# Patient Record
Sex: Female | Born: 1990 | Race: Black or African American | Hispanic: No | Marital: Single | Smoking: Current every day smoker
Health system: Southern US, Community
[De-identification: ages and names within clinical notes are randomized; demographics above are authoritative.]

---
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
## 2010-06-07 ENCOUNTER — Inpatient Hospital Stay (HOSPITAL_COMMUNITY)
Admission: AD | Admit: 2010-06-07 | Discharge: 2010-06-07 | Disposition: A | Discharge status: Home / Self Care | Payer: Medicaid Other | Source: Ambulatory Visit | Attending: Obstetrics and Gynecology | Admitting: Obstetrics and Gynecology

## 2010-06-07 DIAGNOSIS — O4700 False labor before 37 completed weeks of gestation, unspecified trimester: Secondary | ICD-10-CM | POA: Insufficient documentation

## 2010-06-07 LAB — URINALYSIS, ROUTINE W REFLEX MICROSCOPIC
Bilirubin Urine: NEGATIVE
Hgb urine dipstick: NEGATIVE
Ketones, ur: NEGATIVE mg/dL
Protein, ur: NEGATIVE mg/dL
Urine Glucose, Fasting: NEGATIVE mg/dL
Urobilinogen, UA: 1 mg/dL (ref 0.0–1.0)
pH: 6.5 (ref 5.0–8.0)

## 2015-08-17 ENCOUNTER — Encounter (HOSPITAL_BASED_OUTPATIENT_CLINIC_OR_DEPARTMENT_OTHER): Payer: Self-pay | Admitting: *Deleted

## 2015-08-17 ENCOUNTER — Emergency Department (HOSPITAL_BASED_OUTPATIENT_CLINIC_OR_DEPARTMENT_OTHER)
Admission: EM | Admit: 2015-08-17 | Discharge: 2015-08-17 | Disposition: A | Discharge status: Home / Self Care | Payer: Medicaid Other | Attending: Emergency Medicine | Admitting: Emergency Medicine

## 2015-08-17 DIAGNOSIS — N760 Acute vaginitis: Secondary | ICD-10-CM | POA: Insufficient documentation

## 2015-08-17 DIAGNOSIS — B9689 Other specified bacterial agents as the cause of diseases classified elsewhere: Secondary | ICD-10-CM

## 2015-08-17 DIAGNOSIS — N72 Inflammatory disease of cervix uteri: Secondary | ICD-10-CM

## 2015-08-17 DIAGNOSIS — F17210 Nicotine dependence, cigarettes, uncomplicated: Secondary | ICD-10-CM | POA: Insufficient documentation

## 2015-08-17 LAB — URINALYSIS, ROUTINE W REFLEX MICROSCOPIC
BILIRUBIN URINE: NEGATIVE
GLUCOSE, UA: NEGATIVE mg/dL
HGB URINE DIPSTICK: NEGATIVE
KETONES UR: NEGATIVE mg/dL
Nitrite: NEGATIVE
PROTEIN: NEGATIVE mg/dL
Specific Gravity, Urine: 1.026 (ref 1.005–1.030)
pH: 6.5 (ref 5.0–8.0)

## 2015-08-17 LAB — WET PREP, GENITAL
Sperm: NONE SEEN
TRICH WET PREP: NONE SEEN
Yeast Wet Prep HPF POC: NONE SEEN

## 2015-08-17 LAB — URINE MICROSCOPIC-ADD ON: RBC / HPF: NONE SEEN RBC/hpf (ref 0–5)

## 2015-08-17 LAB — PREGNANCY, URINE: PREG TEST UR: NEGATIVE

## 2015-08-17 MED ORDER — METRONIDAZOLE 500 MG PO TABS: 500.0000 mg | ORAL_TABLET | Freq: Two times a day (BID) | ORAL | Status: DC

## 2015-08-17 MED ORDER — CEFTRIAXONE SODIUM 250 MG IJ SOLR
250.0000 mg | Freq: Once | INTRAMUSCULAR | Status: AC
  Administered 2015-08-17: 250 mg via INTRAMUSCULAR
  Filled 2015-08-17: qty 250

## 2015-08-17 MED ORDER — FLUCONAZOLE 150 MG PO TABS: 150.0000 mg | ORAL_TABLET | Freq: Once | ORAL | Status: DC

## 2015-08-17 MED ORDER — DOXYCYCLINE HYCLATE 100 MG PO CAPS: 100.0000 mg | ORAL_CAPSULE | Freq: Two times a day (BID) | ORAL | Status: DC

## 2015-08-17 NOTE — ED Provider Notes (Signed)
CSN: [id number]     Arrival date & time 08/17/15  1754 History   By signing my name below, I, [name], attest that this documentation has been prepared under the direction and in the presence of Loren Racer, MD.  Electronically Signed: [name], ED Scribe. 08/17/2015. 8:21 PM.   Chief Complaint  Patient presents with  . Vaginal Discharge   The history is provided by the patient. No language interpreter was used.    HPI Comments: Glenda Aguirre is a 25 y.o. female without any pertinent past medical history who presents to the Emergency Department complaining of constant, ongoing vaginal Discharge x 2 days. Discharge is described as white in color. She also reports intermittent vaginal pain. No aggravating or alleviating factors at this time. No OTC medications or home remedies attempted prior to arrival. No recent fever, chills, dysuria, sore throat, rashes, or hematuria. Pt states she is currently sexually active and admits to having recent unprotected sex. She admits to previously being treated for STDs. However, pt believes this may be related to a "bad yeast infection". No known allergies to medications.   PCP: Pcp Not In System    History reviewed. No pertinent past medical history. History reviewed. No pertinent past surgical history. History reviewed. No pertinent family history. Social History  Substance Use Topics  . Smoking status: Current Every Day Smoker -- 1.00 packs/day    Types: Cigarettes  . Smokeless tobacco: None  . Alcohol Use: No   OB History    No data available     Review of Systems  Constitutional: Negative for fever and chills.  HENT: Negative for sore throat.   Respiratory: Negative for cough.   Cardiovascular: Negative for chest pain.  Gastrointestinal: Negative for nausea, vomiting, abdominal pain, diarrhea and constipation.  Genitourinary: Positive for vaginal discharge and vaginal pain. Negative for dysuria, frequency, hematuria, flank pain,  vaginal bleeding and pelvic pain.  Musculoskeletal: Negative for myalgias, back pain, neck pain and neck stiffness.  Skin: Negative for rash.  Neurological: Negative for dizziness, weakness, numbness and headaches.  Psychiatric/Behavioral: Negative for confusion.  All other systems reviewed and are negative.     Allergies  Review of patient's allergies indicates no known allergies.  Home Medications   Prior to Admission medications   Medication Sig Start Date End Date Taking? Authorizing Provider  doxycycline (VIBRAMYCIN) 100 MG capsule Take 1 capsule (100 mg total) by mouth 2 (two) times daily. One po bid x 7 days 08/17/15   Loren Racer, MD  metroNIDAZOLE (FLAGYL) 500 MG tablet Take 1 tablet (500 mg total) by mouth 2 (two) times daily. 08/17/15   Loren Racer, MD   Triage Vitals: BP 132/71 mmHg  Pulse 76  Temp(Src) 98.8 F (37.1 C) (Oral)  Resp 18  Ht  (1.676 m)  Wt 180 lb (81.647 kg)  BMI 29.07 kg/m2  SpO2 100%   Physical Exam  Constitutional: She is oriented to person, place, and time. She appears well-developed and well-nourished. No distress.  HENT:  Head: Normocephalic and atraumatic.  Mouth/Throat: Oropharynx is clear and moist.  Eyes: EOM are normal. Pupils are equal, round, and reactive to light.  Neck: Normal range of motion. Neck supple.  Cardiovascular: Normal rate and regular rhythm.   Pulmonary/Chest: Effort normal and breath sounds normal. No respiratory distress. She has no wheezes. She has no rales. She exhibits no tenderness.  Abdominal: Soft. Bowel sounds are normal. She exhibits no distension and no mass. There is no tenderness.  There is no rebound and no guarding.  Genitourinary:  Thick white vaginal discharge. No cervical motion tenderness. No fundal or adnexal masses or tenderness.  Musculoskeletal: Normal range of motion. She exhibits no edema or tenderness.  No CVA tenderness bilaterally.  Neurological: She is alert and oriented to  person, place, and time.  Moves all extremities without deficit. Sensation is fully intact.  Skin: Skin is warm and dry. No rash noted. No erythema.  Psychiatric: She has a normal mood and affect. Her behavior is normal.  Nursing note and vitals reviewed.   ED Course  Procedures (including critical care time)  DIAGNOSTIC STUDIES: Oxygen Saturation is 98% on RA, Normal by my interpretation.    COORDINATION OF CARE: 8:16 PM- Will order urinalysis and pregnancy urine. Discussed treatment plan with pt at bedside and pt agreed to plan.     Labs Review Labs Reviewed  WET PREP, GENITAL - Abnormal; Notable for the following:    Clue Cells Wet Prep HPF POC PRESENT (*)    WBC, Wet Prep HPF POC MANY (*)    All other components within normal limits  URINALYSIS, ROUTINE W REFLEX MICROSCOPIC (NOT AT [facility]) - Abnormal; Notable for the following:    Leukocytes, UA SMALL (*)    All other components within normal limits  URINE MICROSCOPIC-ADD ON - Abnormal; Notable for the following:    Squamous Epithelial / LPF 0-5 (*)    Bacteria, UA FEW (*)    All other components within normal limits  PREGNANCY, URINE  GC/CHLAMYDIA PROBE AMP ([place]) NOT AT [facility]    Imaging Review No results found. I have personally reviewed and evaluated these images and lab results as part of my medical decision-making.   EKG Interpretation None      MDM   Final diagnoses:  Bacterial vaginosis  Cervicitis    I personally performed the services described in this documentation, which was scribed in my presence. The recorded information has been reviewed and is accurate.   White blood cells and clue cells present on wet prep. We'll treat for BV and cervicitis. Advised of same from sex for one week and have all partners evaluated  Loren Raceravid Khole Branch, MD 08/17/15 2151

## 2015-08-17 NOTE — ED Notes (Signed)
Vaginal discharge and itching x 2 days.

## 2015-08-17 NOTE — ED Notes (Signed)
Pt verbalizes understanding of d/c instructions and denies any further needs at this time. 

## 2015-08-17 NOTE — Discharge Instructions (Signed)
Cervicitis Cervicitis is a soreness and swelling (inflammation) of the cervix. Your cervix is located at the bottom of your uterus. It opens up to the vagina. CAUSES   Sexually transmitted infections (STIs).   Allergic reaction.   Medicines or birth control devices that are put in the vagina.   Injury to the cervix.   Bacterial infections.  RISK FACTORS You are at greater risk if you:  Have unprotected sexual intercourse.  Have sexual intercourse with many partners.  Began sexual intercourse at an early age.  Have a history of STIs. SYMPTOMS  There may be no symptoms. If symptoms occur, they may include:   Gray, white, yellow, or bad-smelling vaginal discharge.   Pain or itching of the area outside the vagina.   Painful sexual intercourse.   Lower abdominal or lower back pain, especially during intercourse.   Frequent urination.   Abnormal vaginal bleeding between periods, after sexual intercourse, or after menopause.   Pressure or a heavy feeling in the pelvis.  DIAGNOSIS  Diagnosis is made after a pelvic exam. Other tests may include:   Examination of any discharge under a microscope (wet prep).   A Pap test.  TREATMENT  Treatment will depend on the cause of cervicitis. If it is caused by an STI, both you and your partner will need to be treated. Antibiotic medicines will be given.  HOME CARE INSTRUCTIONS   Do not have sexual intercourse until your health care provider says it is okay.   Do not have sexual intercourse until your partner has been treated, if your cervicitis is caused by an STI.   Take your antibiotics as directed. Finish them even if you start to feel better.  SEEK MEDICAL CARE IF:  Your symptoms come back.   You have a fever.  MAKE SURE YOU:   Understand these instructions.  Will watch your condition.  Will get help right away if you are not doing well or get worse.   This information is not intended to replace  advice given to you by your health care provider. Make sure you discuss any questions you have with your health care provider.   Document Released: 04/07/2005 Document Revised: 04/12/2013 Document Reviewed: 09/29/2012 Elsevier Interactive Patient Education 2016 [employer]  Bacterial Vaginosis Bacterial vaginosis is an infection of the vagina. It happens when too many germs (bacteria) grow in the vagina. Having this infection puts you at risk for getting other infections from sex. Treating this infection can help lower your risk for other infections, such as:   Chlamydia.  Gonorrhea.  HIV.  Herpes. HOME CARE  Take your medicine as told by your doctor.  Finish your medicine even if you start to feel better.  Tell your sex partner that you have an infection. They should see their doctor for treatment.  During treatment:  Avoid sex or use condoms correctly.  Do not douche.  Do not drink alcohol unless your doctor tells you it is ok.  Do not breastfeed unless your doctor tells you it is ok. GET HELP IF:  You are not getting better after 3 days of treatment.  You have more grey fluid (discharge) coming from your vagina than before.  You have more pain than before.  You have a fever. MAKE SURE YOU:   Understand these instructions.  Will watch your condition.  Will get help right away if you are not doing well or get worse.   This information is not intended to replace advice  given to you by your health care provider. Make sure you discuss any questions you have with your health care provider.   Document Released: 01/15/2008 Document Revised: 04/28/2014 Document Reviewed: 11/17/2012 Elsevier Interactive Patient Education [employer].

## 2015-08-20 LAB — GC/CHLAMYDIA PROBE AMP (~~LOC~~) NOT AT ARMC
Chlamydia: NEGATIVE
NEISSERIA GONORRHEA: NEGATIVE

## 2015-09-03 ENCOUNTER — Emergency Department (HOSPITAL_BASED_OUTPATIENT_CLINIC_OR_DEPARTMENT_OTHER)
Admission: EM | Admit: 2015-09-03 | Discharge: 2015-09-03 | Disposition: A | Discharge status: Home / Self Care | Payer: Medicaid Other | Attending: Emergency Medicine | Admitting: Emergency Medicine

## 2015-09-03 ENCOUNTER — Emergency Department (HOSPITAL_BASED_OUTPATIENT_CLINIC_OR_DEPARTMENT_OTHER): Payer: Medicaid Other

## 2015-09-03 ENCOUNTER — Encounter (HOSPITAL_BASED_OUTPATIENT_CLINIC_OR_DEPARTMENT_OTHER): Payer: Self-pay

## 2015-09-03 DIAGNOSIS — R519 Headache, unspecified: Secondary | ICD-10-CM

## 2015-09-03 DIAGNOSIS — F17210 Nicotine dependence, cigarettes, uncomplicated: Secondary | ICD-10-CM | POA: Insufficient documentation

## 2015-09-03 DIAGNOSIS — R51 Headache: Secondary | ICD-10-CM | POA: Insufficient documentation

## 2015-09-03 MED ORDER — METOCLOPRAMIDE HCL 10 MG PO TABS
10.0000 mg | ORAL_TABLET | Freq: Once | ORAL | Status: AC
  Administered 2015-09-03: 10 mg via ORAL
  Filled 2015-09-03: qty 1

## 2015-09-03 MED ORDER — KETOROLAC TROMETHAMINE 15 MG/ML IJ SOLN
15.0000 mg | Freq: Once | INTRAMUSCULAR | Status: AC
  Administered 2015-09-03: 15 mg via INTRAVENOUS
  Filled 2015-09-03: qty 1

## 2015-09-03 NOTE — ED Provider Notes (Signed)
CSN: [id number]     Arrival date & time 09/03/15  1116 History   First MD Initiated Contact with Patient 09/03/15 1252     Chief Complaint  Patient presents with  . Headache    HPI   25 year old female presents today with complaints of headache. Patient reports significant past medical history of headaches, notes that this is similar to typical presentation. She describes it as pain behind her eyes, with throbbing in her temples. She reports light sensitivity, notes some nausea, denies any vomiting. Patient denies any fever or chills, neck stiffness, trauma to the head, or any focal neurological deficits. She denies any excessive caffeine, drug use, or any known exacerbating factors. Patient reports symptoms are typical of previous although she's never had a headache last this long. She has never been formally evaluated for headaches.  History reviewed. No pertinent past medical history. History reviewed. No pertinent past surgical history. No family history on file. Social History  Substance Use Topics  . Smoking status: Current Some Day Smoker -- 1.00 packs/day    Types: Cigarettes  . Smokeless tobacco: None  . Alcohol Use: No   OB History    No data available     Review of Systems  All other systems reviewed and are negative.  Allergies  Review of patient's allergies indicates no known allergies.  Home Medications   Prior to Admission medications   Not on File   BP 99/64 mmHg  Pulse 73  Temp(Src) 99 F (37.2 C) (Oral)  Resp 18  Ht 5\' 6"  (1.676 m)  Wt 87.998 kg  BMI 31.33 kg/m2  SpO2 100%   Physical Exam  Constitutional: She is oriented to person, place, and time. She appears well-developed and well-nourished.  HENT:  Head: Normocephalic and atraumatic.  Eyes: Conjunctivae are normal. Pupils are equal, round, and reactive to light. Right eye exhibits no discharge. Left eye exhibits no discharge. No scleral icterus.  Neck: Normal range of motion. Neck supple. No  JVD present. No tracheal deviation present.  No meningeal signs  Pulmonary/Chest: Effort normal. No stridor.  Neurological: She is alert and oriented to person, place, and time. She has normal strength. No cranial nerve deficit or sensory deficit. She displays a negative Romberg sign. Coordination and gait normal. GCS eye subscore is 4. GCS verbal subscore is 5. GCS motor subscore is 6.  Reflex Scores:      Patellar reflexes are 2+ on the right side and 2+ on the left side. Skin: Skin is warm and dry. No rash noted. No erythema.  Psychiatric: She has a normal mood and affect. Her behavior is normal. Judgment and thought content normal.  Nursing note and vitals reviewed.   ED Course  Procedures (including critical care time) Labs Review Labs Reviewed - No data to display  Imaging Review Ct Head Wo Contrast  09/03/2015  CLINICAL DATA:  Four day history of headache.  Nausea for 1 day EXAM: CT HEAD WITHOUT CONTRAST TECHNIQUE: Contiguous axial images were obtained from the base of the skull through the vertex without intravenous contrast. COMPARISON:  None. FINDINGS: The ventricles are normal in size and configuration. There is no intracranial mass, hemorrhage, extra-axial fluid collection, or midline shift. Gray-white compartments appear normal. No acute infarct evident. Bony calvarium appears intact. Visualized mastoid air cells are clear. Visualized orbits appear symmetric bilaterally. IMPRESSION: Study within normal limits. Electronically Signed   By: Bretta BangWilliam  Woodruff III M.D.   On: 09/03/2015 14:04   I have personally  reviewed and evaluated these images and lab results as part of my medical decision-making.   EKG Interpretation None      MDM   Final diagnoses:  Acute nonintractable headache, unspecified headache type    Labs:   Imaging: cT head  Consults:  Therapeutics:Toradol, Reglan  Discharge Meds:   Assessment/Plan: 25 year old female presents today with headache.  Patient has significant past history of the same. Today's presentation is typical of the same although patient has never had headaches lasting this long. Patient has never been formally evaluated for headaches. Patient had normal CT head here, good symptomatic improvement with above medications. Patient is afebrile, nontoxic in no acute distress., No red flags for headache. She was discharged home with symptomatic urine structures and strict return precautions. She verbalized her understanding and agreement today's plan had no further questions or concerns at the time of discharge.        Eyvonne Mechanic, PA-C 09/03/15 1533  Laurence Spates, MD 09/05/15 [phone]

## 2015-09-03 NOTE — ED Notes (Signed)
C/o HA x 4 days-pain to bilat temples-NAD-steady gait

## 2015-09-03 NOTE — ED Notes (Signed)
Patient transported to CT 

## 2015-09-27 ENCOUNTER — Emergency Department (HOSPITAL_BASED_OUTPATIENT_CLINIC_OR_DEPARTMENT_OTHER)
Admission: EM | Admit: 2015-09-27 | Discharge: 2015-09-28 | Disposition: A | Discharge status: Home / Self Care | Payer: BLUE CROSS/BLUE SHIELD | Attending: Emergency Medicine | Admitting: Emergency Medicine

## 2015-09-27 ENCOUNTER — Encounter (HOSPITAL_BASED_OUTPATIENT_CLINIC_OR_DEPARTMENT_OTHER): Payer: Self-pay

## 2015-09-27 DIAGNOSIS — R51 Headache: Secondary | ICD-10-CM | POA: Diagnosis present

## 2015-09-27 DIAGNOSIS — F17210 Nicotine dependence, cigarettes, uncomplicated: Secondary | ICD-10-CM | POA: Diagnosis not present

## 2015-09-27 DIAGNOSIS — R519 Headache, unspecified: Secondary | ICD-10-CM

## 2015-09-27 NOTE — ED Notes (Signed)
Pt c/o headache for a week with mild nausea and mild photosensitivity, not relieved by OTC meds

## 2015-09-28 MED ORDER — DIPHENHYDRAMINE HCL 50 MG/ML IJ SOLN
25.0000 mg | Freq: Once | INTRAMUSCULAR | Status: AC
  Administered 2015-09-28: 25 mg via INTRAVENOUS
  Filled 2015-09-28: qty 1

## 2015-09-28 MED ORDER — SODIUM CHLORIDE 0.9 % IV BOLUS (SEPSIS)
1000.0000 mL | Freq: Once | INTRAVENOUS | Status: AC
  Administered 2015-09-28: 1000 mL via INTRAVENOUS

## 2015-09-28 MED ORDER — PROCHLORPERAZINE EDISYLATE 5 MG/ML IJ SOLN
5.0000 mg | Freq: Once | INTRAMUSCULAR | Status: AC
  Administered 2015-09-28: 5 mg via INTRAMUSCULAR
  Filled 2015-09-28: qty 2

## 2015-09-28 MED ORDER — KETOROLAC TROMETHAMINE 30 MG/ML IJ SOLN
30.0000 mg | Freq: Once | INTRAMUSCULAR | Status: AC
  Administered 2015-09-28: 30 mg via INTRAVENOUS
  Filled 2015-09-28: qty 1

## 2015-09-28 NOTE — ED Provider Notes (Signed)
CSN: [id number]     Arrival date & time 09/27/15  2342 History   First MD Initiated Contact with Patient 09/28/15 0001     Chief Complaint  Patient presents with  . Headache     (Consider location/radiation/quality/duration/timing/severity/associated sxs/prior Treatment) Patient is a 25 y.o. female presenting with headaches.  Headache Associated symptoms: no fever, no nausea, no vomiting and no weakness    Glenda Aguirre is a 25 y.o. female who complains of headaches for 1 week(s). Description of pain: dull pain, global. Associated symptoms: mild photophobia. Pain relief: intermittent relief with  otc meds, however HA rebounds.. Precipitating factors: none. She denies a history of recent head injury.  Prior neurological history: negative for, phonophobia, UL throbbing, N/V, visual changes, stiff neck, neck pain, rash, or "thunderclap" onset. Denies Fever.   History reviewed. No pertinent past medical history. History reviewed. No pertinent past surgical history. No family history on file. Social History  Substance Use Topics  . Smoking status: Current Some Day Smoker -- 1.00 packs/day    Types: Cigarettes  . Smokeless tobacco: None  . Alcohol Use: No   OB History    No data available     Review of Systems  Constitutional: Negative for fever.  Eyes: Negative for visual disturbance.  Gastrointestinal: Negative for nausea and vomiting.  Neurological: Positive for headaches. Negative for weakness.  All other systems reviewed and are negative.     Allergies  Review of patient's allergies indicates no known allergies.  Home Medications   Prior to Admission medications   Not on File   BP 120/64 mmHg  Pulse 94  Temp(Src) 98.8 F (37.1 C) (Oral)  Resp 18  Ht 5\' 6"  (1.676 m)  Wt 87.998 kg  BMI 31.33 kg/m2  SpO2 100% Physical Exam  Constitutional: She is oriented to person, place, and time. She appears well-developed and well-nourished. No distress.  HENT:  Head:  Normocephalic and atraumatic.  Mouth/Throat: Oropharynx is clear and moist.  Eyes: Conjunctivae and EOM are normal. Pupils are equal, round, and reactive to light. No scleral icterus.  No horizontal, vertical or rotational nystagmus  Neck: Normal range of motion. Neck supple.  Full active and passive ROM without pain No midline or paraspinal tenderness No nuchal rigidity or meningeal signs  Cardiovascular: Normal rate, regular rhythm and intact distal pulses.   Pulmonary/Chest: Effort normal and breath sounds normal. No respiratory distress. She has no wheezes. She has no rales.  Abdominal: Soft. Bowel sounds are normal. There is no tenderness. There is no rebound and no guarding.  Musculoskeletal: Normal range of motion.  Lymphadenopathy:    She has no cervical adenopathy.  Neurological: She is alert and oriented to person, place, and time. She has normal reflexes. No cranial nerve deficit. She exhibits normal muscle tone. Coordination normal.  Mental Status:  Alert, oriented, thought content appropriate. Speech fluent without evidence of aphasia. Able to follow 2 step commands without difficulty.  Cranial Nerves:  II:  Peripheral visual fields grossly normal, pupils equal, round, reactive to light III,IV, VI: ptosis not present, extra-ocular motions intact bilaterally  V,VII: smile symmetric, facial light touch sensation equal VIII: hearing grossly normal bilaterally  IX,X: midline uvula rise  XI: bilateral shoulder shrug equal and strong XII: midline tongue extension  Motor:  5/5 in upper and lower extremities bilaterally including strong and equal grip strength and dorsiflexion/plantar flexion Sensory: Pinprick and light touch normal in all extremities.  Deep Tendon Reflexes: 2+ and symmetric  Cerebellar: normal  finger-to-nose with bilateral upper extremities Gait: normal gait and balance CV: distal pulses palpable throughout   Skin: Skin is warm and dry. No rash noted. She is not  diaphoretic.  Psychiatric: She has a normal mood and affect. Her behavior is normal. Judgment and thought content normal.  Nursing note and vitals reviewed.   ED Course  Procedures (including critical care time) Labs Review Labs Reviewed - No data to display  Imaging Review No results found. I have personally reviewed and evaluated these images and lab results as part of my medical decision-making.   EKG Interpretation None      MDM   Final diagnoses:  None  Pt HA treated and improved while in ED.  Presentation is like pts typical HA and non concerning for [facility], [facility], Meningitis, or temporal arteritis. Pt is afebrile with no focal neuro deficits, nuchal rigidity, or change in vision. Pt is to follow up with PCP to discuss prophylactic medication. Pt verbalizes understanding and is agreeable with plan to dc.     Arthor Captain, PA-C 10/01/15 [id number]  Rolland Porter, MD 10/13/15 1330

## 2015-09-28 NOTE — Discharge Instructions (Signed)
You are having a headache. No specific cause was found today for your headache. It may have been a migraine or other cause of headache. Stress, anxiety, fatigue, and depression are common triggers for headaches. Your headache today does not appear to be life-threatening or require hospitalization, but often the exact cause of headaches is not determined in the emergency department. Therefore, follow-up with your doctor is very important to find out what may have caused your headache, and whether or not you need any further diagnostic testing or treatment. Sometimes headaches can appear benign (not harmful), but then more serious symptoms can develop which should prompt an immediate re-evaluation by your doctor or the emergency department. °SEEK MEDICAL ATTENTION IF: °You develop possible problems with medications prescribed.  °The medications don't resolve your headache, if it recurs , or if you have multiple episodes of vomiting or can't take fluids. °You have a change from the usual headache. °RETURN IMMEDIATELY IF you develop a sudden, severe headache or confusion, become poorly responsive or faint, develop a fever above 100.4F or problem breathing, have a change in speech, vision, swallowing, or understanding, or develop new weakness, numbness, tingling, incoordination, or have a seizure. ° °General Headache Without Cause °A headache is pain or discomfort felt around the head or neck area. The specific cause of a headache may not be found. There are many causes and types of headaches. A few common ones are: °· Tension headaches. °· Migraine headaches. °· Cluster headaches. °· Chronic daily headaches. °HOME CARE INSTRUCTIONS  °Watch your condition for any changes. Take these steps to help with your condition: °Managing Pain °· Take over-the-counter and prescription medicines only as told by your health care provider. °· Lie down in a dark, quiet room when you have a headache. °· If directed, apply ice to the head  and neck area: °¨ Put ice in a plastic bag. °¨ Place a towel between your skin and the bag. °¨ Leave the ice on for 20 minutes, 2-3 times per day. °· Use a heating pad or hot shower to apply heat to the head and neck area as told by your health care provider. °· Keep lights dim if bright lights bother you or make your headaches worse. °Eating and Drinking °· Eat meals on a regular schedule. °· Limit alcohol use. °· Decrease the amount of caffeine you drink, or stop drinking caffeine. °General Instructions °· Keep all follow-up visits as told by your health care provider. This is important. °· Keep a headache journal to help find out what may trigger your headaches. For example, write down: °¨ What you eat and drink. °¨ How much sleep you get. °¨ Any change to your diet or medicines. °· Try massage or other relaxation techniques. °· Limit stress. °· Sit up straight, and do not tense your muscles. °· Do not use tobacco products, including cigarettes, chewing tobacco, or e-cigarettes. If you need help quitting, ask your health care provider. °· Exercise regularly as told by your health care provider. °· Sleep on a regular schedule. Get 7-9 hours of sleep, or the amount recommended by your health care provider. °SEEK MEDICAL CARE IF:  °· Your symptoms are not helped by medicine. °· You have a headache that is different from the usual headache. °· You have nausea or you vomit. °· You have a fever. °SEEK IMMEDIATE MEDICAL CARE IF:  °· Your headache becomes severe. °· You have repeated vomiting. °· You have a stiff neck. °· You have a loss   of vision.  You have problems with speech.  You have pain in the eye or ear.  You have muscular weakness or loss of muscle control.  You lose your balance or have trouble walking.  You feel faint or pass out.  You have confusion.   This information is not intended to replace advice given to you by your health care provider. Make sure you discuss any questions you have with  your health care provider.   Document Released: 04/07/2005 Document Revised: 12/27/2014 Document Reviewed: 07/31/2014 Elsevier Interactive Patient Education 2016 [employer]  Analgesic Rebound Headaches An analgesic rebound headache is a headache that returns after pain medicine (analgesic) that was taken to treat the initial headache wears off. People who suffer from tension, migraine, or cluster headaches are at risk for developing rebound headaches. Any type of primary headache can return as a rebound headache if you regularly take analgesics more than three times a week. If the cycle of rebound headaches continues, they become chronic daily headaches.  CAUSES Analgesics frequently associated with this problem include common over-the-counter medicines like aspirin, ibuprofen, acetaminophen, sinus relief medicines, and other medicines that contain caffeine. Narcotic pain medicines are also a common cause of rebound headaches.  SIGNS AND SYMPTOMS The symptoms of rebound headaches are the same as the symptoms of your initial headache. Symptoms of specific types of headaches include: Tension headache  Pressure around the head.  Dull, aching head pain.  Pain felt over the front and sides of the head.  Tenderness in the muscles of the head, neck and shoulders. Migraine Headache  Pulsing or throbbing pain on one or both sides of the head.  Severe pain that interferes with daily activities.  Pain that is worsened by physical activity.  Nausea, vomiting, or both.  Pain with exposure to bright light, loud noises, or strong smells.  General sensitivity to bright light, loud noises, or strong smells.  Visual changes.  Numbness of one or both arms. Cluster Headaches  Severe pain that begins in or around one eye or temple.  Redness in the eye on the same side as the pain.  Droopy or swollen eyelid.  One-sided head pain.  Nausea.  Runny nose.  Sweaty, pale facial  skin.  Restlessness. DIAGNOSIS  Analgesic rebound headaches are diagnosed by reviewing your medical history. This includes the nature of your initial headaches, as well as the type of pain medicines you have been using to treat your headaches and how often you take them. TREATMENT Discontinuing frequent use of the analgesic medicine will typically reduce the frequency of the rebound episodes. This may initially worsen your headaches but eventually the pain should become more manageable, less frequent, and less severe.  Seeing a headache specialists may helpful. He or she may be able to help you manage your headaches and to make sure there is not another cause of the headaches. Alternative methods of stress relief such as acupuncture, counseling, biofeedback, and massage may also be helpful. Talk with your health care provider about which alternative treatments might be good for you. HOME CARE INSTRUCTIONS Stopping the regular use of pain medicine can be difficult. Follow your health care provider's instructions carefully. Keep all of your appointments. Avoid triggers that are known to cause your primary headaches. SEEK MEDICAL CARE IF: You continue to experience headaches after following your health care provider's recommended treatments. SEEK IMMEDIATE MEDICAL CARE IF:  You develop new headache pain.  You develop headache pain that is different than what  you have experienced in the past.  You develop numbness or tingling in your arms or legs.  You develop changes in your speech or vision. MAKE SURE YOU:  Understand these instructions.  Will watch your child's condition.  Will get help right away if your child is not doing well or gets worse.   This information is not intended to replace advice given to you by your health care provider. Make sure you discuss any questions you have with your health care provider.   Document Released: 06/28/2003 Document Revised: 04/28/2014 Document  Reviewed: 10/21/2012 Elsevier Interactive Patient Education [employer]

## 2021-04-22 ENCOUNTER — Emergency Department (HOSPITAL_BASED_OUTPATIENT_CLINIC_OR_DEPARTMENT_OTHER)
Admission: EM | Admit: 2021-04-22 | Discharge: 2021-04-22 | Disposition: A | Discharge status: Home / Self Care | Payer: Medicaid Other | Attending: Emergency Medicine | Admitting: Emergency Medicine

## 2021-04-22 ENCOUNTER — Other Ambulatory Visit: Payer: Self-pay

## 2021-04-22 ENCOUNTER — Encounter (HOSPITAL_BASED_OUTPATIENT_CLINIC_OR_DEPARTMENT_OTHER): Payer: Self-pay

## 2021-04-22 ENCOUNTER — Emergency Department (HOSPITAL_BASED_OUTPATIENT_CLINIC_OR_DEPARTMENT_OTHER): Payer: Medicaid Other

## 2021-04-22 DIAGNOSIS — R0981 Nasal congestion: Secondary | ICD-10-CM | POA: Diagnosis present

## 2021-04-22 DIAGNOSIS — J101 Influenza due to other identified influenza virus with other respiratory manifestations: Secondary | ICD-10-CM | POA: Diagnosis not present

## 2021-04-22 DIAGNOSIS — Z20822 Contact with and (suspected) exposure to covid-19: Secondary | ICD-10-CM | POA: Insufficient documentation

## 2021-04-22 DIAGNOSIS — M549 Dorsalgia, unspecified: Secondary | ICD-10-CM | POA: Diagnosis not present

## 2021-04-22 DIAGNOSIS — M25562 Pain in left knee: Secondary | ICD-10-CM

## 2021-04-22 DIAGNOSIS — S39012A Strain of muscle, fascia and tendon of lower back, initial encounter: Secondary | ICD-10-CM

## 2021-04-22 LAB — RESP PANEL BY RT-PCR (FLU A&B, COVID) ARPGX2
Influenza A by PCR: POSITIVE — AB
Influenza B by PCR: NEGATIVE
SARS Coronavirus 2 by RT PCR: NEGATIVE

## 2021-04-22 MED ORDER — METHOCARBAMOL 750 MG PO TABS: 750.0000 mg | ORAL_TABLET | Freq: Three times a day (TID) | ORAL | 0 refills | Status: AC | PRN

## 2021-04-22 NOTE — Discharge Instructions (Addendum)
It was our pleasure to provide your ER care today - we hope that you feel better.  Take acetaminophen or ibuprofen as need for pain. You may also take robaxin as need for muscle pain/spasm - no driving when taking.   Your flu test is positive - see attached information.   Follow up with primary care doctor in one week if symptoms fail to improve/resolve.  Return to ER if worse, new symptoms, severe or intractable pain, increased trouble breathing, or other concern.
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
## 2022-06-30 ENCOUNTER — Emergency Department (HOSPITAL_BASED_OUTPATIENT_CLINIC_OR_DEPARTMENT_OTHER)
Admission: EM | Admit: 2022-06-30 | Discharge: 2022-06-30 | Disposition: A | Discharge status: Home / Self Care | Payer: No Typology Code available for payment source | Attending: Emergency Medicine | Admitting: Emergency Medicine

## 2022-06-30 ENCOUNTER — Encounter (HOSPITAL_BASED_OUTPATIENT_CLINIC_OR_DEPARTMENT_OTHER): Payer: Self-pay | Admitting: Emergency Medicine

## 2022-06-30 ENCOUNTER — Emergency Department (HOSPITAL_BASED_OUTPATIENT_CLINIC_OR_DEPARTMENT_OTHER): Payer: No Typology Code available for payment source

## 2022-06-30 ENCOUNTER — Other Ambulatory Visit: Payer: Self-pay

## 2022-06-30 DIAGNOSIS — R519 Headache, unspecified: Secondary | ICD-10-CM | POA: Diagnosis not present

## 2022-06-30 DIAGNOSIS — R1030 Lower abdominal pain, unspecified: Secondary | ICD-10-CM | POA: Diagnosis not present

## 2022-06-30 DIAGNOSIS — H538 Other visual disturbances: Secondary | ICD-10-CM | POA: Insufficient documentation

## 2022-06-30 DIAGNOSIS — Z1152 Encounter for screening for COVID-19: Secondary | ICD-10-CM | POA: Insufficient documentation

## 2022-06-30 DIAGNOSIS — K529 Noninfective gastroenteritis and colitis, unspecified: Secondary | ICD-10-CM

## 2022-06-30 DIAGNOSIS — R197 Diarrhea, unspecified: Secondary | ICD-10-CM | POA: Insufficient documentation

## 2022-06-30 LAB — COMPREHENSIVE METABOLIC PANEL
ALT: 21 U/L (ref 0–44)
AST: 18 U/L (ref 15–41)
Albumin: 4 g/dL (ref 3.5–5.0)
Alkaline Phosphatase: 66 U/L (ref 38–126)
Anion gap: 6 (ref 5–15)
BUN: 10 mg/dL (ref 6–20)
CO2: 24 mmol/L (ref 22–32)
Calcium: 8.3 mg/dL — ABNORMAL LOW (ref 8.9–10.3)
Chloride: 97 mmol/L — ABNORMAL LOW (ref 98–111)
Creatinine, Ser: 0.97 mg/dL (ref 0.44–1.00)
GFR, Estimated: >60 mL/min (ref >60)
Glucose, Bld: 107 mg/dL — ABNORMAL HIGH (ref 70–99)
Potassium: 3.6 mmol/L (ref 3.5–5.1)
Sodium: 127 mmol/L — ABNORMAL LOW (ref 135–145)
Total Bilirubin: 0.7 mg/dL (ref 0.3–1.2)
Total Protein: 7.9 g/dL (ref 6.5–8.1)

## 2022-06-30 LAB — CBC WITH DIFFERENTIAL/PLATELET
Abs Immature Granulocytes: 0.03 10*3/uL (ref 0.00–0.07)
Basophils Absolute: 0.1 10*3/uL (ref 0.0–0.1)
Basophils Relative: 1 %
Eosinophils Absolute: 0.1 10*3/uL (ref 0.0–0.5)
Eosinophils Relative: 1 %
HCT: 46.3 % — ABNORMAL HIGH (ref 36.0–46.0)
Hemoglobin: 16 g/dL — ABNORMAL HIGH (ref 12.0–15.0)
Immature Granulocytes: 0 %
Lymphocytes Relative: 26 %
Lymphs Abs: 2.9 10*3/uL (ref 0.7–4.0)
MCH: 30.5 pg (ref 26.0–34.0)
MCHC: 34.6 g/dL (ref 30.0–36.0)
MCV: 88.2 fL (ref 80.0–100.0)
Monocytes Absolute: 0.7 10*3/uL (ref 0.1–1.0)
Monocytes Relative: 6 %
Neutro Abs: 7.4 10*3/uL (ref 1.7–7.7)
Neutrophils Relative %: 66 %
Platelets: 282 10*3/uL (ref 150–400)
RBC: 5.25 MIL/uL — ABNORMAL HIGH (ref 3.87–5.11)
RDW: 13.4 % (ref 11.5–15.5)
WBC: 11.2 10*3/uL — ABNORMAL HIGH (ref 4.0–10.5)
nRBC: 0 % (ref 0.0–0.2)

## 2022-06-30 LAB — URINALYSIS, ROUTINE W REFLEX MICROSCOPIC
Bilirubin Urine: NEGATIVE
Glucose, UA: NEGATIVE mg/dL
Ketones, ur: NEGATIVE mg/dL
Leukocytes,Ua: NEGATIVE
Nitrite: POSITIVE — AB
Protein, ur: NEGATIVE mg/dL
Specific Gravity, Urine: 1.02 (ref 1.005–1.030)
pH: 6.5 (ref 5.0–8.0)

## 2022-06-30 LAB — URINALYSIS, MICROSCOPIC (REFLEX)

## 2022-06-30 LAB — LIPASE, BLOOD: Lipase: 28 U/L (ref 11–51)

## 2022-06-30 LAB — RESP PANEL BY RT-PCR (RSV, FLU A&B, COVID)  RVPGX2
Influenza A by PCR: NEGATIVE
Influenza B by PCR: NEGATIVE
Resp Syncytial Virus by PCR: NEGATIVE
SARS Coronavirus 2 by RT PCR: NEGATIVE

## 2022-06-30 LAB — PREGNANCY, URINE: Preg Test, Ur: NEGATIVE

## 2022-06-30 MED ORDER — IOHEXOL 300 MG/ML  SOLN: 100.0000 mL | Freq: Once | INTRAMUSCULAR | Status: DC | PRN

## 2022-06-30 MED ORDER — IOHEXOL 300 MG/ML  SOLN
100.0000 mL | Freq: Once | INTRAMUSCULAR | Status: AC | PRN
  Administered 2022-06-30: 100 mL via INTRAVENOUS

## 2022-06-30 MED ORDER — AMOXICILLIN-POT CLAVULANATE 875-125 MG PO TABS: 1.0000 | ORAL_TABLET | Freq: Two times a day (BID) | ORAL | 0 refills | Status: DC

## 2022-06-30 MED ORDER — SODIUM CHLORIDE 0.9 % IV BOLUS
1000.0000 mL | Freq: Once | INTRAVENOUS | Status: AC
  Administered 2022-06-30: 1000 mL via INTRAVENOUS

## 2022-06-30 MED ORDER — AMOXICILLIN-POT CLAVULANATE 875-125 MG PO TABS
1.0000 | ORAL_TABLET | Freq: Once | ORAL | Status: AC
Start: 2022-06-30 — End: 2022-06-30
  Administered 2022-06-30: 1 via ORAL
  Filled 2022-06-30: qty 1

## 2022-06-30 NOTE — ED Triage Notes (Signed)
Pt reports headache past few days. Went to eye doctor THursday. They said she needed an LP and scheduled for April 5th. Pt here today for cramping and rectal bleeding and headache and dizziness. Denies fevers. States last time she had this, she had COVID.

## 2022-06-30 NOTE — ED Notes (Signed)
Patient resting quietly in stretcher, respirations even, unlabored, no acute distress noted. Denies needs at this time. Awaiting CT.

## 2022-06-30 NOTE — Discharge Instructions (Addendum)
The blood in your diarrhea likely it is from colitis.  I have ordered Augmentin twice daily for a week.  You are expected to continue to have blood in your stool for several days.  Please follow-up with your doctor.  Please keep your appointment for lumbar puncture and follow-up with your eye doctor  Return to ER if you have worse blurry vision, severe abdominal pain or vomiting or fever

## 2022-06-30 NOTE — ED Provider Notes (Signed)
[facility] Provider Note   CSN: [id number] Arrival date & time: 06/30/22  1433     History  Chief Complaint  Patient presents with   Abdominal Pain    Glenda Aguirre is a 32 y.o. female here presenting with abdominal pain and blood in her stool and headaches.  Patient states that about a week ago, she has worsening blurry vision so she went to see the ophthalmologist.  She had a new prescription glasses which she did not get yet.  She had a dilated eye exam and unclear what they saw but they arrange for an outpatient lumbar puncture in 3 weeks.  She states that her vision has not changed.  She states that what brought her in here today is some diarrhea and she had 2 episodes with blood-tinged.  Also has diffuse abdominal cramps.  She states that she had colitis previously.  She also had COVID with similar symptoms.  The history is provided by the patient.       Home Medications Prior to Admission medications   Medication Sig Start Date End Date Taking? Authorizing Provider  methocarbamol (ROBAXIN) 750 MG tablet Take 1 tablet (750 mg total) by mouth 3 (three) times daily as needed (muscle spasm/pain). 04/22/21   Lajean Saver, MD      Allergies    Patient has no known allergies.    Review of Systems   Review of Systems  Gastrointestinal:  Positive for abdominal pain and blood in stool.  All other systems reviewed and are negative.   Physical Exam Updated Vital Signs BP 118/82   Pulse 60   Temp 98.2 F (36.8 C) (Oral)   Resp 16   LMP 05/22/2022   SpO2 100%  Physical Exam Vitals and nursing note reviewed.  Constitutional:      Appearance: She is well-developed.  HENT:     Head: Normocephalic.     Mouth/Throat:     Mouth: Mucous membranes are moist.  Eyes:     Extraocular Movements: Extraocular movements intact.     Pupils: Pupils are equal, round, and reactive to light.  Cardiovascular:     Rate and Rhythm: Normal rate  and regular rhythm.     Heart sounds: Normal heart sounds.  Pulmonary:     Effort: Pulmonary effort is normal.     Breath sounds: Normal breath sounds.  Abdominal:     General: Abdomen is flat.     Comments: Mild lower abdominal tenderness.  No rebound or guarding  Skin:    General: Skin is warm.     Capillary Refill: Capillary refill takes less than 2 seconds.  Neurological:     General: No focal deficit present.     Mental Status: She is alert and oriented to person, place, and time.  Psychiatric:        Behavior: Behavior normal.     ED Results / Procedures / Treatments   Labs (all labs ordered are listed, but only abnormal results are displayed) Labs Reviewed  URINALYSIS, ROUTINE W REFLEX MICROSCOPIC - Abnormal; Notable for the following components:      Result Value   Hgb urine dipstick TRACE (*)    Nitrite POSITIVE (*)    All other components within normal limits  COMPREHENSIVE METABOLIC PANEL - Abnormal; Notable for the following components:   Sodium 127 (*)    Chloride 97 (*)    Glucose, Bld 107 (*)    Calcium 8.3 (*)  All other components within normal limits  CBC WITH DIFFERENTIAL/PLATELET - Abnormal; Notable for the following components:   WBC 11.2 (*)    RBC 5.25 (*)    Hemoglobin 16.0 (*)    HCT 46.3 (*)    All other components within normal limits  URINALYSIS, MICROSCOPIC (REFLEX) - Abnormal; Notable for the following components:   Bacteria, UA MANY (*)    All other components within normal limits  RESP PANEL BY RT-PCR (RSV, FLU A&B, COVID)  RVPGX2  LIPASE, BLOOD  PREGNANCY, URINE    EKG None  Radiology No results found.  Procedures Procedures    Medications Ordered in ED Medications  sodium chloride 0.9 % bolus 1,000 mL (1,000 mLs Intravenous New Bag/Given 06/30/22 1953)    ED Course/ Medical Decision Making/ A&P                             Medical Decision Making Glenda Aguirre is a 32 y.o. female here presenting with blurry vision  and diarrhea and abdominal cramps.  I think she likely has viral gastroenteritis causing her symptoms.  Also consider colitis or diverticulitis as well.  Of note, she did see an ophthalmologist and has an LP scheduled.  I wonder if she has pseudotumor cerebri.  Since her blurry vision is stable and she has LP scheduled, I do not think she needs an emergent LP in the ED.  I do not think she has meningitis.  Will do a CT head to make sure she has no hydrocephalus or brain mass.  Will give IV fluids and reassess.  9:44 PM Patient's sodium level slightly low at 127.  Patient was given IV fluids.  CT abdomen pelvis showed mild colitis.  CT head unremarkable.  Patient is feeling better now.  Will discharge home with course of Augmentin.  Problems Addressed: Colitis: acute illness or injury  Amount and/or Complexity of Data Reviewed Labs: ordered. Decision-making details documented in ED Course. Radiology: ordered and independent interpretation performed. Decision-making details documented in ED Course.  Risk Prescription drug management.    Final Clinical Impression(s) / ED Diagnoses Final diagnoses:  None    Rx / DC Orders ED Discharge Orders     None         Drenda Freeze, MD 06/30/22 2145

## 2022-07-17 ENCOUNTER — Other Ambulatory Visit: Payer: Self-pay

## 2022-07-17 ENCOUNTER — Encounter (HOSPITAL_BASED_OUTPATIENT_CLINIC_OR_DEPARTMENT_OTHER): Payer: Self-pay | Admitting: Emergency Medicine

## 2022-07-17 DIAGNOSIS — Z202 Contact with and (suspected) exposure to infections with a predominantly sexual mode of transmission: Secondary | ICD-10-CM | POA: Insufficient documentation
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
## 2022-07-18 ENCOUNTER — Emergency Department (HOSPITAL_BASED_OUTPATIENT_CLINIC_OR_DEPARTMENT_OTHER)
Admission: EM | Admit: 2022-07-18 | Discharge: 2022-07-18 | Disposition: A | Discharge status: Home / Self Care | Payer: No Typology Code available for payment source | Attending: Emergency Medicine | Admitting: Emergency Medicine

## 2022-07-18 DIAGNOSIS — Z202 Contact with and (suspected) exposure to infections with a predominantly sexual mode of transmission: Secondary | ICD-10-CM | POA: Diagnosis not present

## 2022-07-18 MED ORDER — LIDOCAINE HCL (PF) 1 % IJ SOLN
5.0000 mL | Freq: Once | INTRAMUSCULAR | Status: AC
  Administered 2022-07-18: 5 mL
  Filled 2022-07-18: qty 5

## 2022-07-18 MED ORDER — CEFTRIAXONE SODIUM 500 MG IJ SOLR
500.0000 mg | Freq: Once | INTRAMUSCULAR | Status: AC
  Administered 2022-07-18: 500 mg via INTRAMUSCULAR
  Filled 2022-07-18: qty 500

## 2022-07-18 MED ORDER — FLUCONAZOLE 150 MG PO TABS: 150.0000 mg | ORAL_TABLET | Freq: Once | ORAL | 0 refills | Status: DC | PRN

## 2022-07-18 MED ORDER — DOXYCYCLINE HYCLATE 100 MG PO CAPS: 100.0000 mg | ORAL_CAPSULE | Freq: Two times a day (BID) | ORAL | 0 refills | Status: AC

## 2022-07-18 NOTE — Discharge Instructions (Addendum)
We tested you for chlamydia and gonorrhea.  We have treated you for a presumptive gonorrhea infection.  We are sending antibiotics to treat possible chlamydia.  It is important that you finish the course, unless your test returns negative.  I will also send medication for yeast infection if you develop this after your antibiotics.  It is very important that your partners also treated.  Use condoms to prevent sexually transmitted infections and undesired pregnancy.

## 2022-07-18 NOTE — ED Provider Notes (Signed)
[place] [facility] Provider Note   CSN: [id number] Arrival date & time: 07/17/22  2318     History  Chief Complaint  Patient presents with   Exposure to STD    Glenda Aguirre is a 32 y.o. female.  Patient presents for evaluation after STI exposure.  She reports that her sexual female partner reported having penile discharge today.  They last had intercourse yesterday.  She reports she had negative STI testing at her PCP office on Friday (1 week ago). She denies any vaginal discharge, vaginal odor, abdominal pain, urinary symptoms.       Home Medications Prior to Admission medications   Medication Sig Start Date End Date Taking? Authorizing Provider  doxycycline (VIBRAMYCIN) 100 MG capsule Take 1 capsule (100 mg total) by mouth 2 (two) times daily for 14 days. 07/18/22 08/01/22 Yes Payton Prinsen, Dawson Bills, DO  fluconazole (DIFLUCAN) 150 MG tablet Take 1 tablet (150 mg total) by mouth once as needed for up to 1 dose (Yeast infection). 07/18/22  Yes Sharion Settler, DO  methocarbamol (ROBAXIN) 750 MG tablet Take 1 tablet (750 mg total) by mouth 3 (three) times daily as needed (muscle spasm/pain). 04/22/21   Lajean Saver, MD      Allergies    Patient has no known allergies.    Review of Systems   Review of Systems  Constitutional:  Negative for fever.  Gastrointestinal:  Negative for abdominal pain.  Genitourinary:  Negative for dysuria and vaginal discharge.    Physical Exam Updated Vital Signs BP (!) 139/95 (BP Location: Left Arm)   Pulse 77   Temp 98.7 F (37.1 C) (Oral)   Resp 16   LMP 05/22/2022 Comment: Negative pregnancy test in ED today;  SpO2 100%  Physical Exam Constitutional:      General: She is not in acute distress.    Appearance: Normal appearance. She is obese. She is not ill-appearing.  HENT:     Head: Normocephalic.     Nose: Nose normal.  Cardiovascular:     Rate and Rhythm: Normal rate.  Pulmonary:     Effort:  Pulmonary effort is normal.  Abdominal:     General: There is no distension.     Palpations: Abdomen is soft.     Tenderness: There is no abdominal tenderness.  Musculoskeletal:     Cervical back: Neck supple.  Skin:    General: Skin is warm and dry.     Capillary Refill: Capillary refill takes less than 2 seconds.  Neurological:     General: No focal deficit present.     Mental Status: She is alert.     ED Results / Procedures / Treatments   Labs (all labs ordered are listed, but only abnormal results are displayed) Labs Reviewed  GC/CHLAMYDIA PROBE AMP ([place]) NOT AT [facility]    EKG None  Radiology No results found.  Procedures Procedures    Medications Ordered in ED Medications  cefTRIAXone (ROCEPHIN) injection 500 mg (has no administration in time range)  lidocaine (PF) (XYLOCAINE) 1 % injection 5 mL (has no administration in time range)    ED Course/ Medical Decision Making/ A&P                             Medical Decision Making 32 y/o F with remote history of trichomonal infection who presents after STI exposure.  She has had recent negative STI testing, however, we  discussed given exposure would recommend treatment. She was understandable frustrated and upset at situation. Was amenable to IM CTX and will send script for doxy. Recommend barrier protection to prevent STI. Recommend avoiding intercourse until she and partner are both treated. Will send rx for diflucan as patient reports she typically gets yeast infections after antibiotics.         Final Clinical Impression(s) / ED Diagnoses Final diagnoses:  STD exposure    Rx / DC Orders ED Discharge Orders          Ordered    doxycycline (VIBRAMYCIN) 100 MG capsule  2 times daily        07/18/22 0117    fluconazole (DIFLUCAN) 150 MG tablet  Once PRN        07/18/22 0117              Sharion Settler, DO 07/18/22 0131    Palumbo, April, MD 07/18/22 [id number]

## 2022-07-21 LAB — GC/CHLAMYDIA PROBE AMP (~~LOC~~) NOT AT ARMC
Chlamydia: NEGATIVE
Comment: NEGATIVE
Comment: NORMAL
Neisseria Gonorrhea: NEGATIVE

## 2022-07-22 ENCOUNTER — Telehealth: Payer: Self-pay | Admitting: *Deleted

## 2022-07-22 NOTE — Telephone Encounter (Signed)
Pt called for results of STD testing.  RNCM advised that results are negative and to utilize MyChart for updated information in the future.

## 2022-08-18 ENCOUNTER — Other Ambulatory Visit: Payer: Self-pay | Admitting: Optometry

## 2022-08-18 DIAGNOSIS — H4711 Papilledema associated with increased intracranial pressure: Secondary | ICD-10-CM

## 2022-08-26 ENCOUNTER — Ambulatory Visit: Payer: No Typology Code available for payment source

## 2022-08-26 DIAGNOSIS — R519 Headache, unspecified: Secondary | ICD-10-CM

## 2022-08-26 DIAGNOSIS — H4711 Papilledema associated with increased intracranial pressure: Secondary | ICD-10-CM

## 2022-08-26 MED ORDER — GADOBUTROL 1 MMOL/ML IV SOLN
10.0000 mL | Freq: Once | INTRAVENOUS | Status: AC | PRN
Start: 2022-08-26 — End: 2022-08-26
  Administered 2022-08-26: 10 mL via INTRAVENOUS

## 2022-11-30 ENCOUNTER — Encounter (HOSPITAL_BASED_OUTPATIENT_CLINIC_OR_DEPARTMENT_OTHER): Payer: Self-pay | Admitting: Emergency Medicine

## 2022-11-30 ENCOUNTER — Emergency Department (HOSPITAL_BASED_OUTPATIENT_CLINIC_OR_DEPARTMENT_OTHER): Payer: No Typology Code available for payment source

## 2022-11-30 DIAGNOSIS — D72829 Elevated white blood cell count, unspecified: Secondary | ICD-10-CM | POA: Insufficient documentation

## 2022-11-30 DIAGNOSIS — R1030 Lower abdominal pain, unspecified: Secondary | ICD-10-CM | POA: Diagnosis present

## 2022-11-30 DIAGNOSIS — N12 Tubulo-interstitial nephritis, not specified as acute or chronic: Secondary | ICD-10-CM | POA: Diagnosis not present

## 2022-11-30 LAB — URINALYSIS, MICROSCOPIC (REFLEX): WBC, UA: >50 WBC/hpf (ref 0–5)

## 2022-11-30 LAB — URINALYSIS, ROUTINE W REFLEX MICROSCOPIC
Bilirubin Urine: NEGATIVE
Glucose, UA: NEGATIVE mg/dL
Ketones, ur: NEGATIVE mg/dL
Nitrite: NEGATIVE
Protein, ur: >=300 mg/dL — AB
Specific Gravity, Urine: 1.025 (ref 1.005–1.030)
pH: 7 (ref 5.0–8.0)

## 2022-11-30 LAB — PREGNANCY, URINE: Preg Test, Ur: NEGATIVE

## 2022-11-30 NOTE — ED Triage Notes (Signed)
Pt c/o RLQ pain w/ radiation to RT lower back x 2 days

## 2022-11-30 NOTE — ED Notes (Signed)
Urine sent to lab 

## 2022-12-01 ENCOUNTER — Emergency Department (HOSPITAL_BASED_OUTPATIENT_CLINIC_OR_DEPARTMENT_OTHER)
Admission: EM | Admit: 2022-12-01 | Discharge: 2022-12-01 | Disposition: A | Discharge status: Home / Self Care | Payer: No Typology Code available for payment source | Attending: Emergency Medicine | Admitting: Emergency Medicine

## 2022-12-01 ENCOUNTER — Other Ambulatory Visit: Payer: Self-pay

## 2022-12-01 DIAGNOSIS — N12 Tubulo-interstitial nephritis, not specified as acute or chronic: Secondary | ICD-10-CM

## 2022-12-01 LAB — WET PREP, GENITAL
Sperm: NONE SEEN
Trich, Wet Prep: NONE SEEN
WBC, Wet Prep HPF POC: <10 (ref <10)
Yeast Wet Prep HPF POC: NONE SEEN

## 2022-12-01 MED ORDER — ONDANSETRON HCL 4 MG/2ML IJ SOLN
4.0000 mg | Freq: Once | INTRAMUSCULAR | Status: AC
  Administered 2022-12-01: 4 mg via INTRAVENOUS
  Filled 2022-12-01: qty 2

## 2022-12-01 MED ORDER — CEPHALEXIN 500 MG PO CAPS: 500.0000 mg | ORAL_CAPSULE | Freq: Two times a day (BID) | ORAL | 0 refills | Status: AC

## 2022-12-01 MED ORDER — KETOROLAC TROMETHAMINE 30 MG/ML IJ SOLN
15.0000 mg | Freq: Once | INTRAMUSCULAR | Status: AC
  Administered 2022-12-01: 15 mg via INTRAVENOUS
  Filled 2022-12-01: qty 1

## 2022-12-01 MED ORDER — SODIUM CHLORIDE 0.9 % IV SOLN
1.0000 g | Freq: Once | INTRAVENOUS | Status: AC
  Administered 2022-12-01: 1 g via INTRAVENOUS
  Filled 2022-12-01: qty 10

## 2022-12-01 MED ORDER — ONDANSETRON 4 MG PO TBDP: 4.0000 mg | ORAL_TABLET | Freq: Three times a day (TID) | ORAL | 0 refills | Status: AC | PRN

## 2022-12-01 NOTE — ED Provider Notes (Signed)
[place] [facility]  Provider Note  CSN: [id number] Arrival date & time: 11/30/22 2140  History Chief Complaint  Patient presents with   Abdominal Pain    Glenda Aguirre is a 32 y.o. female who presents for evaluation of lower abdominal pain, dysuria and R flank pain. She has not had fever or vomiting. Symptoms have been present for 2 days. She was seen by Ob/Gyn on 7/16 for dysuria and had UTI with e-coli positive culture that day, treated with macrobid. She returned to Ob/Gyn for some concerns about her Nexplanon on 7/31, UA then neg for UTI but reportedly positive for trichomonas, although she denies any vaginal discharge.She was treated with flagyl. She was feeling well until symptoms returned 2 days ago.    Home Medications Prior to Admission medications   Medication Sig Start Date End Date Taking? Authorizing Provider  cephALEXin (KEFLEX) 500 MG capsule Take 1 capsule (500 mg total) by mouth 2 (two) times daily for 7 days. 12/01/22 12/08/22 Yes Pollyann Savoy, MD  ondansetron (ZOFRAN-ODT) 4 MG disintegrating tablet Take 1 tablet (4 mg total) by mouth every 8 (eight) hours as needed for nausea or vomiting. 12/01/22  Yes Pollyann Savoy, MD  fluconazole (DIFLUCAN) 150 MG tablet Take 1 tablet (150 mg total) by mouth once as needed for up to 1 dose (Yeast infection). 07/18/22   Sabino Dick, DO  methocarbamol (ROBAXIN) 750 MG tablet Take 1 tablet (750 mg total) by mouth 3 (three) times daily as needed (muscle spasm/pain). 04/22/21   Cathren Laine, MD     Allergies    Patient has no known allergies.   Review of Systems   Review of Systems Please see HPI for pertinent positives and negatives  Physical Exam BP 117/69   Pulse 70   Temp 98.4 F (36.9 C) (Oral)   Resp 15   Ht 5\' 6"  (1.676 m)   Wt 93 kg   SpO2 100%   BMI 33.09 kg/m   Physical Exam Vitals and nursing note reviewed.  Constitutional:      Appearance: Normal  appearance.  HENT:     Head: Normocephalic and atraumatic.     Nose: Nose normal.     Mouth/Throat:     Mouth: Mucous membranes are moist.  Eyes:     Extraocular Movements: Extraocular movements intact.     Conjunctiva/sclera: Conjunctivae normal.  Cardiovascular:     Rate and Rhythm: Normal rate.  Pulmonary:     Effort: Pulmonary effort is normal.     Breath sounds: Normal breath sounds.  Abdominal:     General: Abdomen is flat.     Palpations: Abdomen is soft.     Tenderness: There is abdominal tenderness in the suprapubic area. There is no guarding. Negative signs include Murphy's sign and McBurney's sign.  Musculoskeletal:        General: No swelling. Normal range of motion.     Cervical back: Neck supple.  Skin:    General: Skin is warm and dry.  Neurological:     General: No focal deficit present.     Mental Status: She is alert.  Psychiatric:        Mood and Affect: Mood normal.     ED Results / Procedures / Treatments   EKG None  Procedures Procedures  Medications Ordered in the ED Medications  ketorolac (TORADOL) 30 MG/ML injection 15 mg (15 mg Intravenous Given 12/01/22 0342)  ondansetron (ZOFRAN) injection 4 mg (  4 mg Intravenous Given 12/01/22 0342)  cefTRIAXone (ROCEPHIN) 1 g in sodium chloride 0.9 % 100 mL IVPB (0 g Intravenous Stopped 12/01/22 0446)    Initial Impression and Plan  Patient with recently treated UTI and trichomonas infection here with dysuria and flank pain. UA done in triage is consistent with infection. CT negative for stone but there is fullness in the R ureter concerning for ascending infection. I personally viewed the images from radiology studies and agree with radiologist interpretation. Will add blood work. She would like to self-swab for STI given recent positive trichomonas. Will give pain/nausea meds and begin Abx for presumed pyelo.   ED Course   Clinical Course as of 12/01/22 0450  Mon Dec 01, 2022  6269 Wet prep is neg for  trich. CBC with leukocytosis, consistent with suspected pyelo. [CS]  0407 CMP and Lipase are unremarkable.  [CS]  0448 Patient feeling better, remains HDS, no signs of systemic infection/sepsis. Plan discharge with Rx for Keflex, zofran and PCP follow up. RTED for any other concerns.  [CS]    Clinical Course User Index [CS] Pollyann Savoy, MD     MDM Rules/Calculators/A&P Medical Decision Making Given presenting complaint, I considered that admission might be necessary. After review of results from ED lab and/or imaging studies, admission to the hospital is not indicated at this time.    Problems Addressed: Pyelonephritis: acute illness or injury  Amount and/or Complexity of Data Reviewed Labs: ordered. Decision-making details documented in ED Course. Radiology: ordered and independent interpretation performed. Decision-making details documented in ED Course.  Risk Prescription drug management. Decision regarding hospitalization.     Final Clinical Impression(s) / ED Diagnoses Final diagnoses:  Pyelonephritis    Rx / DC Orders ED Discharge Orders          Ordered    cephALEXin (KEFLEX) 500 MG capsule  2 times daily        12/01/22 0450    ondansetron (ZOFRAN-ODT) 4 MG disintegrating tablet  Every 8 hours PRN        12/01/22 0450             Pollyann Savoy, MD 12/01/22 [phone]

## 2022-12-05 ENCOUNTER — Telehealth (HOSPITAL_BASED_OUTPATIENT_CLINIC_OR_DEPARTMENT_OTHER): Payer: Self-pay | Admitting: *Deleted

## 2022-12-05 NOTE — Telephone Encounter (Signed)
Post ED Visit - Positive Culture Follow-up  Culture report reviewed by antimicrobial stewardship pharmacist: [facility] [x]  [facility], [place].D. []  Celedonio Miyamoto, Pharm.D., BCPS AQ-ID []  Garvin Fila, Pharm.D., BCPS []  Georgina Pillion, Pharm.D., BCPS []  [place], [address].D., BCPS, AAHIVP []  Estella Husk, Pharm.D., BCPS, AAHIVP []  Lysle Pearl, PharmD, BCPS []  Phillips Climes, PharmD, BCPS []  Agapito Games, PharmD, BCPS []  Verlan Friends, PharmD []  Mervyn Gay, PharmD, BCPS []  Vinnie Level, PharmD  [facility] []  Len Childs, PharmD []  Greer Pickerel, PharmD []  Adalberto Cole, PharmD []  Perlie Gold, Rph []  Lonell Face) Jean Rosenthal, PharmD []  Earl Many, PharmD []  Junita Push, PharmD []  Dorna Leitz, PharmD []  Terrilee Files, PharmD []  Lynann Beaver, PharmD []  Keturah Barre, PharmD []  Loralee Pacas, PharmD []  Bernadene Person, PharmD   Positive urine culture Treated with Cephalexin, organism sensitive to the same and no further patient follow-up is required at this time.  Virl Axe [facility] 12/05/2022, 11:52 AM

## 2023-07-29 ENCOUNTER — Emergency Department (HOSPITAL_BASED_OUTPATIENT_CLINIC_OR_DEPARTMENT_OTHER)

## 2023-07-29 ENCOUNTER — Encounter (HOSPITAL_BASED_OUTPATIENT_CLINIC_OR_DEPARTMENT_OTHER): Payer: Self-pay | Admitting: Radiology

## 2023-07-29 ENCOUNTER — Emergency Department (HOSPITAL_BASED_OUTPATIENT_CLINIC_OR_DEPARTMENT_OTHER)
Admission: EM | Admit: 2023-07-29 | Discharge: 2023-07-29 | Disposition: A | Discharge status: Home / Self Care | Attending: Emergency Medicine | Admitting: Emergency Medicine

## 2023-07-29 ENCOUNTER — Other Ambulatory Visit: Payer: Self-pay

## 2023-07-29 DIAGNOSIS — N390 Urinary tract infection, site not specified: Secondary | ICD-10-CM | POA: Diagnosis not present

## 2023-07-29 DIAGNOSIS — M79644 Pain in right finger(s): Secondary | ICD-10-CM | POA: Insufficient documentation

## 2023-07-29 DIAGNOSIS — D72829 Elevated white blood cell count, unspecified: Secondary | ICD-10-CM | POA: Diagnosis not present

## 2023-07-29 DIAGNOSIS — F17200 Nicotine dependence, unspecified, uncomplicated: Secondary | ICD-10-CM | POA: Diagnosis not present

## 2023-07-29 DIAGNOSIS — R1030 Lower abdominal pain, unspecified: Secondary | ICD-10-CM

## 2023-07-29 DIAGNOSIS — R1084 Generalized abdominal pain: Secondary | ICD-10-CM | POA: Diagnosis present

## 2023-07-29 LAB — COMPREHENSIVE METABOLIC PANEL WITH GFR
ALT: 22 U/L (ref 0–44)
AST: 22 U/L (ref 15–41)
Albumin: 4.2 g/dL (ref 3.5–5.0)
Alkaline Phosphatase: 65 U/L (ref 38–126)
Anion gap: 9 (ref 5–15)
BUN: 8 mg/dL (ref 6–20)
CO2: 26 mmol/L (ref 22–32)
Calcium: 9.4 mg/dL (ref 8.9–10.3)
Chloride: 101 mmol/L (ref 98–111)
Creatinine, Ser: 1.1 mg/dL — ABNORMAL HIGH (ref 0.44–1.00)
GFR, Estimated: >60 mL/min (ref >60)
Glucose, Bld: 99 mg/dL (ref 70–99)
Potassium: 4 mmol/L (ref 3.5–5.1)
Sodium: 136 mmol/L (ref 135–145)
Total Bilirubin: 0.7 mg/dL (ref 0.0–1.2)
Total Protein: 8.2 g/dL — ABNORMAL HIGH (ref 6.5–8.1)

## 2023-07-29 LAB — CBC
HCT: 49.1 % — ABNORMAL HIGH (ref 36.0–46.0)
Hemoglobin: 16.6 g/dL — ABNORMAL HIGH (ref 12.0–15.0)
MCH: 30.7 pg (ref 26.0–34.0)
MCHC: 33.8 g/dL (ref 30.0–36.0)
MCV: 90.8 fL (ref 80.0–100.0)
Platelets: 302 10*3/uL (ref 150–400)
RBC: 5.41 MIL/uL — ABNORMAL HIGH (ref 3.87–5.11)
RDW: 13.8 % (ref 11.5–15.5)
WBC: 10.7 10*3/uL — ABNORMAL HIGH (ref 4.0–10.5)
nRBC: 0 % (ref 0.0–0.2)

## 2023-07-29 LAB — LIPASE, BLOOD: Lipase: 27 U/L (ref 11–51)

## 2023-07-29 LAB — URINALYSIS, ROUTINE W REFLEX MICROSCOPIC
Bilirubin Urine: NEGATIVE
Glucose, UA: NEGATIVE mg/dL
Ketones, ur: NEGATIVE mg/dL
Nitrite: POSITIVE — AB
Protein, ur: NEGATIVE mg/dL
Specific Gravity, Urine: 1.02 (ref 1.005–1.030)
pH: 5.5 (ref 5.0–8.0)

## 2023-07-29 LAB — URINALYSIS, MICROSCOPIC (REFLEX)

## 2023-07-29 LAB — PREGNANCY, URINE: Preg Test, Ur: NEGATIVE

## 2023-07-29 MED ORDER — FLUCONAZOLE 150 MG PO TABS: 150.0000 mg | ORAL_TABLET | Freq: Every day | ORAL | 0 refills | Status: AC

## 2023-07-29 MED ORDER — ONDANSETRON HCL 4 MG/2ML IJ SOLN
4.0000 mg | Freq: Once | INTRAMUSCULAR | Status: AC
  Administered 2023-07-29: 4 mg via INTRAVENOUS
  Filled 2023-07-29: qty 2

## 2023-07-29 MED ORDER — IOHEXOL 300 MG/ML  SOLN
100.0000 mL | Freq: Once | INTRAMUSCULAR | Status: AC | PRN
  Administered 2023-07-29: 100 mL via INTRAVENOUS

## 2023-07-29 MED ORDER — CEPHALEXIN 500 MG PO CAPS: 500.0000 mg | ORAL_CAPSULE | Freq: Four times a day (QID) | ORAL | 0 refills | Status: AC

## 2023-07-29 MED ORDER — CEPHALEXIN 500 MG PO CAPS: 500.0000 mg | ORAL_CAPSULE | Freq: Four times a day (QID) | ORAL | 0 refills | Status: DC
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
## 2023-07-29 NOTE — ED Notes (Signed)
 Pt in bed lying supine, placed into gown and she ambulated to the bathroom for UA.
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
## 2023-07-29 NOTE — ED Triage Notes (Signed)
 Pt reports that she started having some abdominal pain and nausea since last night.
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
